# Patient Record
Sex: Male | Born: 1990 | Race: White | Hispanic: No | Marital: Single | Smoking: Never smoker
Health system: Southern US, Community
[De-identification: ages and names within clinical notes are randomized; demographics above are authoritative.]

## PROBLEM LIST (undated history)

## (undated) DIAGNOSIS — T883XXA Malignant hyperthermia due to anesthesia, initial encounter: Secondary | ICD-10-CM

## (undated) DIAGNOSIS — Z8489 Family history of other specified conditions: Secondary | ICD-10-CM

## (undated) HISTORY — PX: NO PAST SURGERIES: SHX2092

---
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
## 2019-03-18 ENCOUNTER — Encounter (HOSPITAL_BASED_OUTPATIENT_CLINIC_OR_DEPARTMENT_OTHER): Payer: Self-pay | Admitting: *Deleted

## 2019-03-18 ENCOUNTER — Other Ambulatory Visit: Payer: Self-pay

## 2019-03-18 NOTE — Progress Notes (Signed)
Pt's father has pseudocholinesterase deficiency. Reviewed with Dr Ambrose Pancoast. No further testing or consult needed prior to the dos.

## 2019-03-19 ENCOUNTER — Encounter (HOSPITAL_COMMUNITY): Payer: Self-pay | Admitting: Anesthesiology

## 2019-03-20 ENCOUNTER — Ambulatory Visit (HOSPITAL_BASED_OUTPATIENT_CLINIC_OR_DEPARTMENT_OTHER)
Admission: RE | Admit: 2019-03-20 | Payer: PRIVATE HEALTH INSURANCE | Source: Home / Self Care | Admitting: Orthopedic Surgery

## 2019-03-20 HISTORY — DX: Family history of other specified conditions: Z84.89

## 2019-03-20 HISTORY — DX: Malignant hyperthermia due to anesthesia, initial encounter: T88.3XXA

## 2019-03-20 SURGERY — ARTHROSCOPY, SHOULDER, WITH GLENOID LABRUM REPAIR
Anesthesia: General | Laterality: Right
